# Patient Record
Sex: Female | Born: 1970 | Race: Black or African American | Hispanic: No | Marital: Single | Smoking: Current every day smoker
Health system: Southern US, Community
[De-identification: ages and names within clinical notes are randomized; demographics above are authoritative.]

## PROBLEM LIST (undated history)

## (undated) DIAGNOSIS — D573 Sickle-cell trait: Secondary | ICD-10-CM

## (undated) DIAGNOSIS — E119 Type 2 diabetes mellitus without complications: Secondary | ICD-10-CM

## (undated) DIAGNOSIS — K219 Gastro-esophageal reflux disease without esophagitis: Secondary | ICD-10-CM

## (undated) HISTORY — PX: LEEP: SHX91

## (undated) HISTORY — PX: WRIST SURGERY: SHX841

---

## 2013-04-02 ENCOUNTER — Other Ambulatory Visit: Payer: Self-pay

## 2013-04-23 ENCOUNTER — Other Ambulatory Visit (HOSPITAL_COMMUNITY): Payer: Self-pay | Admitting: Obstetrics and Gynecology

## 2013-04-23 DIAGNOSIS — O09529 Supervision of elderly multigravida, unspecified trimester: Secondary | ICD-10-CM

## 2013-04-24 ENCOUNTER — Encounter (HOSPITAL_COMMUNITY): Payer: Self-pay

## 2013-04-24 ENCOUNTER — Ambulatory Visit (HOSPITAL_COMMUNITY)
Admission: RE | Admit: 2013-04-24 | Discharge: 2013-04-24 | Disposition: A | Discharge status: Home / Self Care | Payer: Medicaid Other | Source: Ambulatory Visit | Attending: Obstetrics and Gynecology | Admitting: Obstetrics and Gynecology

## 2013-04-24 DIAGNOSIS — O24919 Unspecified diabetes mellitus in pregnancy, unspecified trimester: Secondary | ICD-10-CM | POA: Insufficient documentation

## 2013-04-24 DIAGNOSIS — O09529 Supervision of elderly multigravida, unspecified trimester: Secondary | ICD-10-CM | POA: Insufficient documentation

## 2013-04-24 DIAGNOSIS — Z1389 Encounter for screening for other disorder: Secondary | ICD-10-CM | POA: Insufficient documentation

## 2013-04-24 DIAGNOSIS — O352XX0 Maternal care for (suspected) hereditary disease in fetus, not applicable or unspecified: Secondary | ICD-10-CM | POA: Insufficient documentation

## 2013-04-24 DIAGNOSIS — Z363 Encounter for antenatal screening for malformations: Secondary | ICD-10-CM | POA: Insufficient documentation

## 2013-04-24 DIAGNOSIS — O358XX0 Maternal care for other (suspected) fetal abnormality and damage, not applicable or unspecified: Secondary | ICD-10-CM | POA: Insufficient documentation

## 2013-04-24 HISTORY — DX: Type 2 diabetes mellitus without complications: E11.9

## 2013-04-24 HISTORY — DX: Sickle-cell trait: D57.3

## 2013-04-24 LAB — OB RESULTS CONSOLE GC/CHLAMYDIA
Chlamydia: NEGATIVE
Gonorrhea: NEGATIVE

## 2013-04-24 LAB — OB RESULTS CONSOLE RUBELLA ANTIBODY, IGM: Rubella: IMMUNE

## 2013-04-24 LAB — OB RESULTS CONSOLE HEPATITIS B SURFACE ANTIGEN: Hepatitis B Surface Ag: NEGATIVE

## 2013-04-24 LAB — OB RESULTS CONSOLE HGB/HCT, BLOOD
HCT: 36 %
Hemoglobin: 12.4 g/dL

## 2013-04-24 LAB — OB RESULTS CONSOLE HIV ANTIBODY (ROUTINE TESTING): HIV: NONREACTIVE

## 2013-04-24 LAB — OB RESULTS CONSOLE RPR: RPR: NONREACTIVE

## 2013-04-26 NOTE — Progress Notes (Signed)
Genetic Counseling  High-Risk Gestation Note  Appointment Date:  04/24/2013 Referred By: [name], MD Date of Birth:  January 06, 1971    Pregnancy History: [id number] Estimated Date of Delivery: 09/23/13 Estimated Gestational Age: [redacted]w[redacted]d Attending: Particia Nearing, MD   Ms. Jennifer Duffy was seen for genetic counseling regarding a maternal age of 42 y.o.. She will be 65 years old at delivery. She was accompanied by a friend to today's visit. [employer] Genetic Counseling Intern, Ellwood Sayers, assisted with today's genetic counseling under my direct supervision.   She was counseled regarding maternal age and the association with risk for chromosome conditions due to nondisjunction with aging of the ova.   We reviewed chromosomes, nondisjunction, and the associated 1 in 5 risk for fetal aneuploidy related to a maternal age of 42 y.o. at [redacted]w[redacted]d weeks gestation.  She was counseled that the risk for aneuploidy decreases as gestational age increases, accounting for those pregnancies which spontaneously abort.  We specifically discussed Down syndrome (trisomy 57), trisomies 26 and 91, and sex chromosome aneuploidies (47,XXX and 47,XXY) including the common features and prognoses of each.   We also reviewed Ms. Jennifer Duffy' noninvasive prenatal screening (NIPS)/Harmony result, which was previously performed through her OB office. We discussed that these are within normal limits for the conditions screened, showing a less than 1 in 10,000 risk for fetal Down syndrome, trisomy 24, and trisomy 20. In addition, X and Y analysis did not indicate evidence of sex chromosome aneuploidy in the sample, and was consistent with XX (female) gender. We reviewed that this screening detects >99% of pregnancies with Trisomy 21, >98% of pregnancies with Trisomy 18, and approximately 80% of pregnancies with Trisomy 13.   She understands that NIPS provides a pregnancy specific risk for Down syndrome, but is not considered to be  diagnostic.  She was counseled regarding other available screening and diagnostic options including ultrasound and amniocentesis.  The risks, benefits, and limitations of each of these options were reviewed in detail.  We discussed the associated 1 in 300-500 risk for complications from amniocentesis, including spontaneous pregnancy loss. After thoughtful consideration of these options, she elected to proceed with ultrasound only, but declined amniocentesis.  She understands that screening tests cannot rule out all birth defects or genetic syndromes.  The patient was advised of this limitation and states she still does not want diagnostic testing at this time or in the future, given the associated risk of complications.   A complete detailed ultrasound was performed today.  Left sided pyelectasis was visualized at the time of today's ultrasound (4 mm). The remaining visualized fetal anatomy appeared normal. The ultrasound report will be sent under separate cover.  Fetal pyelectasis is defined as the dilatation of the fetal renal pelvis/pelvises due to excess urine. This finding is estimated to occur in 2-3% of fetuses.  The female to female ratio is 2:1.  Typically, babies with mild pyelectasis are born normal and healthy, and we are usually unable to determine why this extra fluid is present.  This urine accumulation may regress, stay the same or continue to accumulate.  The more fluid that accumulates, the more likely this fluid could be the result of a compromise in kidney function, an obstruction, or narrowing of the ureters which transport urine out of the body, thus causing backflow of fluid into the kidneys.  Therefore, it is important to follow pyelectasis to make sure it does not become more concerning.  Also, in some cases postnatal evaluation of baby's kidneys  may be warranted.  The finding of pyelectasis is associated with an increased risk for fetal aneuploidy.  This risk is highest when other  anomalies, fetal differences, or other risk factors for aneuploidy are present. A follow-up ultrasound was planned for 07/04/13 to reevaluate fetal kidneys.   Both family histories were reviewed and found to be contributory for sickle cell anemia in the patient's 40 year-old daughter with a different partner. She reported that her brother also has sickle cell anemia. He is currently 40 years old and also has a history of seizures related to high fevers. Sickle cell anemia affects the shape and function of the red blood cell by producing abnormal hemoglobin. Hemoglobin is a protein in the RBCs that carries oxygen to the body's organs. Individuals who have SCA have a changes within the genes that codes for hemoglobin. This couple was counseled that SCA is inherited in an autosomal recessive manner, and occurs when both copies of the hemoglobin gene are changed and produce an abnormal hemoglobin S. Typically, one abnormal gene for the production of hemoglobin S is inherited from each parent. A carrier of SCA has one altered copy of the gene for hemoglobin and one typical working copy. Carriers of recessive conditions typically do not have symptoms related to the condition because they still have one functioning copy of the gene, and thus some production of the typical protein coded for by that gene.  Given the recessive inheritance, we discussed the importance of understanding carrier status of the father of the pregnancy in order to accurately predict the risk of a hemoglobinopathy in the fetus. The patient reported that the father of the pregnancy has had screening for in the past and that he does NOT have sickle cell trait. She did not have medical records available to verify the reported information. We discussed that other hemoglobin variants (hemoglobin C), when combined with hemoglobin S, can cause a medically significant hemoglobinopathy. We discussed the option of repeat testing (hemoglobin electrophoresis)  for the father of the pregnancy to determine whether he has any hemoglobin variant, including SCT. She understands that an accurate risk assessment cannot be performed without further information. However, assuming that the father of the pregnancy does not have SCT or any other hemoglobin variant, the fetus would not be expected to have an increased risk for a hemoglobinopathy but would have a 1 in 2 (50%) chance to inherit sickle cell trait. We also reviewed the availability of newborn screening in [place] for hemoglobinopathies.   Additionally, the patient reported that her 40 year old son, with a different partner, has epilepsy, with onset at age 65 years. An underlying cause is not known. Epilepsy occurs in approximately 1% of the population and can have many causes.  Approximately 80% of epilepsy is thought to be idiopathic while the remaining 20% is secondary to a variety of factors such as perinatal events, infections, trauma and genetic disease.  A specific diagnosis in an affected individual is necessary to accurately assess the risk for other family members to develop epilepsy.  In the absence of a known etiology, epilepsy is thought to be caused by a combination of genetic and environmental factors, called multifactorial inheritance. Recurrence risk for epilepsy is estimated to be 2.5% for siblings of an individual with primary idiopathic epilepsy. However, given that the current pregnancy is a half-sibling, the recurrence risk may be lower. It would be important for the patient's pediatrician to be aware of this history so that her child(ren)  can be screened and followed appropriately.   The patient also reported a maternal half-brother who died at age 56 years old from colon cancer. She also reported a paternal half-brother (unrelated to her other half-brother) who was diagnosed with colon cancer at age 55 years old. Though most cancers are thought to be sporadic or due to environmental  factors, some families appear to have a strong predisposition to cancers.  When considering a family history of cancer, we look for common types of cancer in multiple family members occurring at younger than typical ages.  If she is concerned about the family history of cancer and would like to learn more about the family's chance for an inherited cancer syndrome, her doctor may refer her to [facility] [phone]). It would be important for her physician to be aware of this history so that she is screened appropriately. Without further information regarding the provided family history, an accurate genetic risk cannot be calculated. Further genetic counseling is warranted if more information is obtained.  Ms. Makenzee Lynam denied exposure to environmental toxins or chemical agents. She denied the use of alcohol, tobacco or street drugs. She denied significant viral illnesses during the course of her pregnancy. Her medical and surgical histories were contributory for diabetes mellitus, for which she is treated with metformin. Fetal echocardiogram was scheduled given the maternal history of diabetes.     I counseled Ms. Mikaia Arya regarding the above risks and available options.  The approximate face-to-face time with the genetic counselor was 40 minutes.  Quinn Plowman, MS Certified Genetic Counselor 04/26/2013

## 2013-07-03 ENCOUNTER — Other Ambulatory Visit (HOSPITAL_COMMUNITY): Payer: Self-pay | Admitting: Obstetrics and Gynecology

## 2013-07-03 DIAGNOSIS — O09529 Supervision of elderly multigravida, unspecified trimester: Secondary | ICD-10-CM

## 2013-07-04 ENCOUNTER — Ambulatory Visit (HOSPITAL_COMMUNITY)
Admission: RE | Admit: 2013-07-04 | Discharge: 2013-07-04 | Disposition: A | Discharge status: Home / Self Care | Payer: Medicaid Other | Source: Ambulatory Visit | Attending: Obstetrics and Gynecology | Admitting: Obstetrics and Gynecology

## 2013-07-04 ENCOUNTER — Other Ambulatory Visit (HOSPITAL_COMMUNITY): Payer: Self-pay | Admitting: Obstetrics and Gynecology

## 2013-07-04 DIAGNOSIS — O352XX0 Maternal care for (suspected) hereditary disease in fetus, not applicable or unspecified: Secondary | ICD-10-CM | POA: Insufficient documentation

## 2013-07-04 DIAGNOSIS — O99330 Smoking (tobacco) complicating pregnancy, unspecified trimester: Secondary | ICD-10-CM | POA: Insufficient documentation

## 2013-07-04 DIAGNOSIS — O09529 Supervision of elderly multigravida, unspecified trimester: Secondary | ICD-10-CM

## 2013-07-04 DIAGNOSIS — O24919 Unspecified diabetes mellitus in pregnancy, unspecified trimester: Secondary | ICD-10-CM | POA: Insufficient documentation

## 2013-08-01 ENCOUNTER — Ambulatory Visit (HOSPITAL_COMMUNITY)
Admission: RE | Admit: 2013-08-01 | Discharge: 2013-08-01 | Disposition: A | Discharge status: Home / Self Care | Payer: Medicaid Other | Source: Ambulatory Visit | Attending: Obstetrics and Gynecology | Admitting: Obstetrics and Gynecology

## 2013-08-01 ENCOUNTER — Other Ambulatory Visit (HOSPITAL_COMMUNITY): Payer: Self-pay | Admitting: Obstetrics and Gynecology

## 2013-08-01 DIAGNOSIS — O09529 Supervision of elderly multigravida, unspecified trimester: Secondary | ICD-10-CM | POA: Insufficient documentation

## 2013-08-01 DIAGNOSIS — O99330 Smoking (tobacco) complicating pregnancy, unspecified trimester: Secondary | ICD-10-CM | POA: Insufficient documentation

## 2013-08-01 DIAGNOSIS — O24919 Unspecified diabetes mellitus in pregnancy, unspecified trimester: Secondary | ICD-10-CM

## 2013-08-01 DIAGNOSIS — O352XX0 Maternal care for (suspected) hereditary disease in fetus, not applicable or unspecified: Secondary | ICD-10-CM | POA: Insufficient documentation

## 2013-08-27 ENCOUNTER — Other Ambulatory Visit (HOSPITAL_COMMUNITY): Payer: Self-pay | Admitting: Obstetrics and Gynecology

## 2013-08-27 DIAGNOSIS — O352XX0 Maternal care for (suspected) hereditary disease in fetus, not applicable or unspecified: Secondary | ICD-10-CM

## 2013-08-27 DIAGNOSIS — O24919 Unspecified diabetes mellitus in pregnancy, unspecified trimester: Secondary | ICD-10-CM

## 2013-08-27 DIAGNOSIS — O99340 Other mental disorders complicating pregnancy, unspecified trimester: Secondary | ICD-10-CM

## 2013-08-27 DIAGNOSIS — O09529 Supervision of elderly multigravida, unspecified trimester: Secondary | ICD-10-CM

## 2013-08-29 ENCOUNTER — Ambulatory Visit (HOSPITAL_COMMUNITY): Payer: Medicaid Other

## 2013-08-29 ENCOUNTER — Encounter (HOSPITAL_COMMUNITY): Payer: Self-pay

## 2013-08-29 ENCOUNTER — Other Ambulatory Visit (HOSPITAL_COMMUNITY): Payer: Self-pay | Admitting: Obstetrics and Gynecology

## 2013-08-29 ENCOUNTER — Ambulatory Visit (HOSPITAL_COMMUNITY)
Admission: RE | Admit: 2013-08-29 | Discharge: 2013-08-29 | Disposition: A | Discharge status: Home / Self Care | Payer: Medicaid Other | Source: Ambulatory Visit | Attending: Obstetrics and Gynecology | Admitting: Obstetrics and Gynecology

## 2013-08-29 DIAGNOSIS — O99340 Other mental disorders complicating pregnancy, unspecified trimester: Secondary | ICD-10-CM

## 2013-08-29 DIAGNOSIS — O09529 Supervision of elderly multigravida, unspecified trimester: Secondary | ICD-10-CM

## 2013-08-29 DIAGNOSIS — O24919 Unspecified diabetes mellitus in pregnancy, unspecified trimester: Secondary | ICD-10-CM

## 2013-08-29 DIAGNOSIS — O352XX0 Maternal care for (suspected) hereditary disease in fetus, not applicable or unspecified: Secondary | ICD-10-CM

## 2013-08-29 DIAGNOSIS — O99330 Smoking (tobacco) complicating pregnancy, unspecified trimester: Secondary | ICD-10-CM | POA: Insufficient documentation

## 2014-02-27 ENCOUNTER — Encounter (HOSPITAL_COMMUNITY): Payer: Self-pay | Admitting: *Deleted

## 2014-04-29 ENCOUNTER — Encounter (HOSPITAL_COMMUNITY): Payer: Self-pay | Admitting: *Deleted

## 2015-03-27 IMAGING — US US OB DETAIL+14 WK
1 series · 12 of 28 positions shown · non-contrast
Comparison: none

[Series 1: us ob detail+14 wk · 0.19mm/px · 12 of 99 slices shown]
[im 4/99]
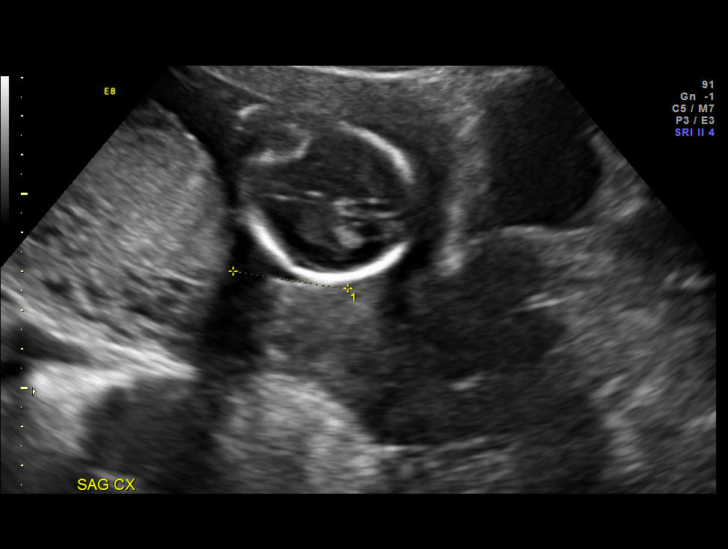
[im 11/99]
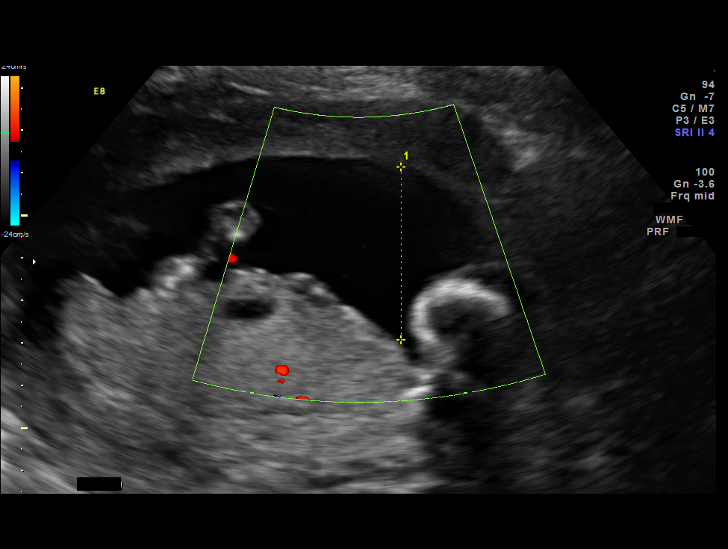
[im 19/99]
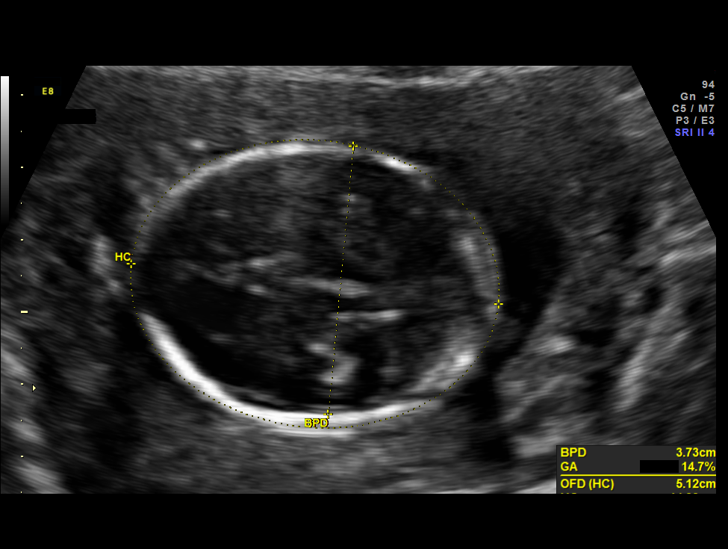
[im 30/99]
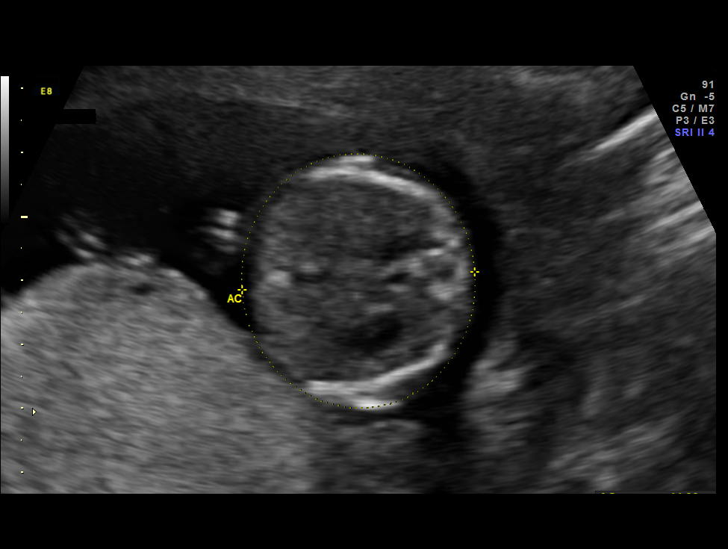
[im 37/99]
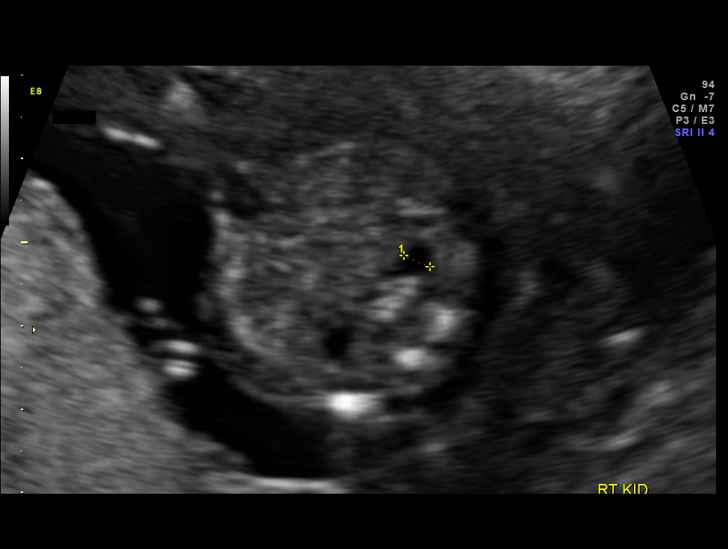
[im 44/99]
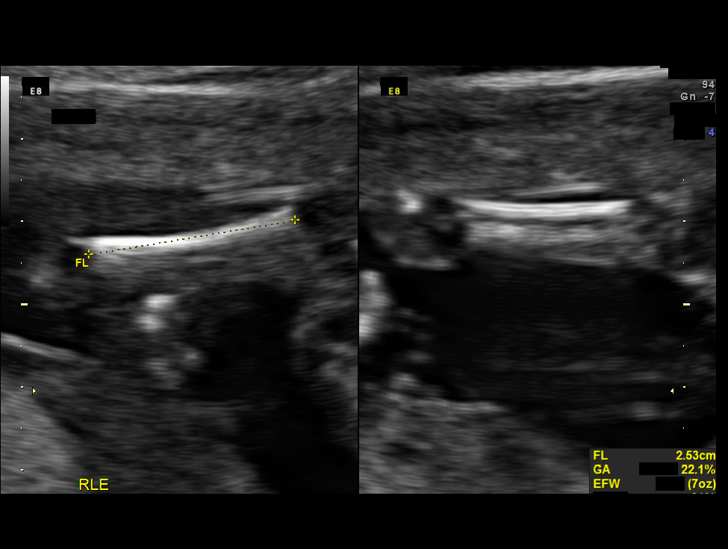
[im 55/99]
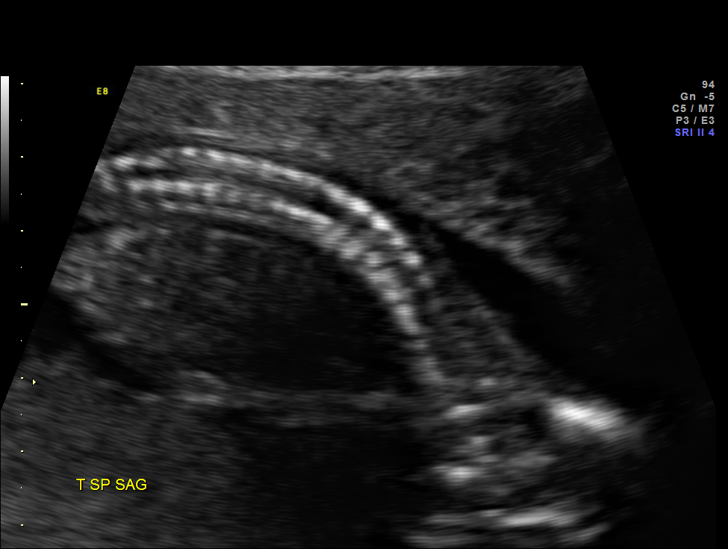
[im 62/99]
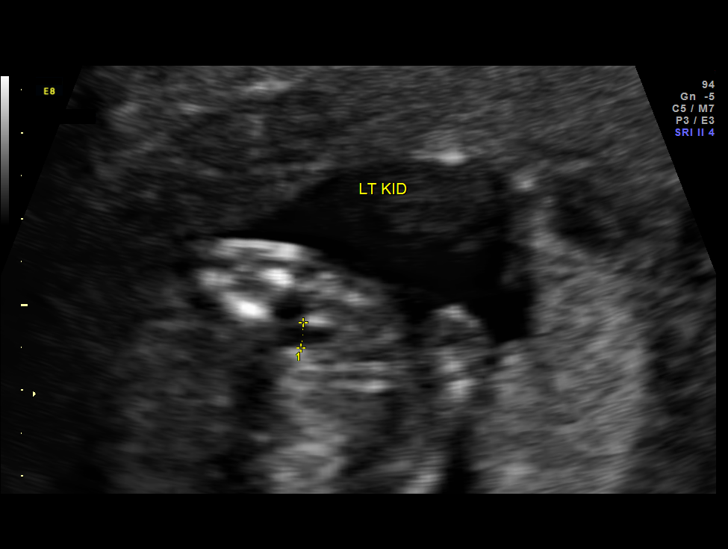
[im 69/99]
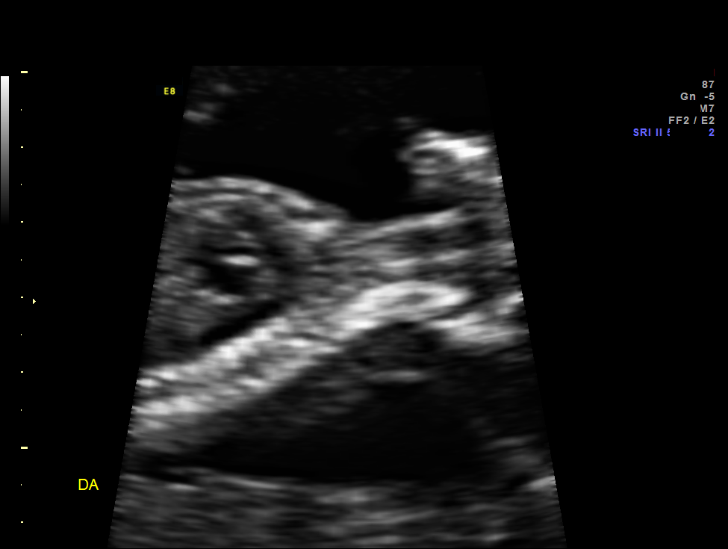
[im 80/99]
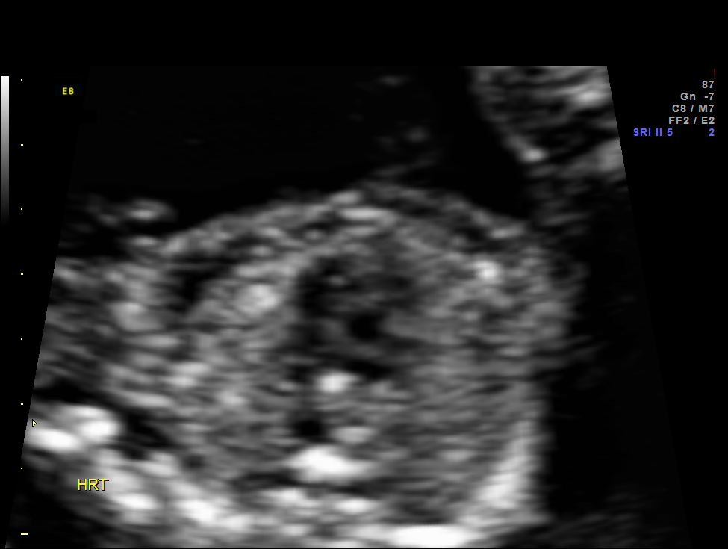
[im 88/99]
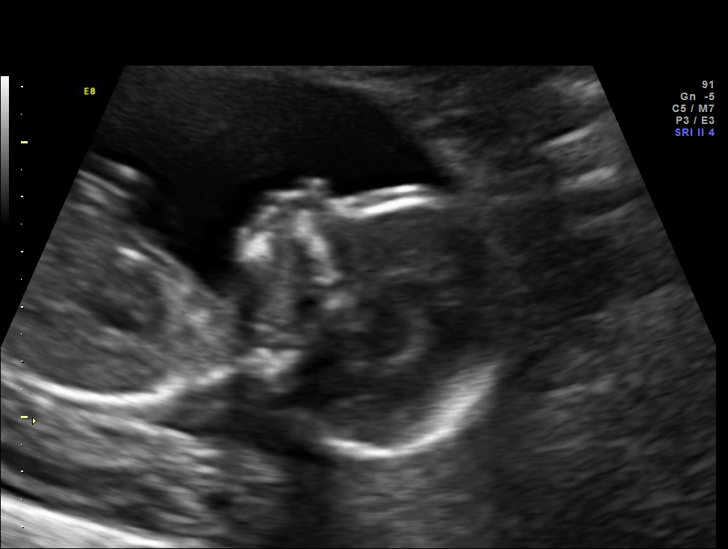
[im 95/99]
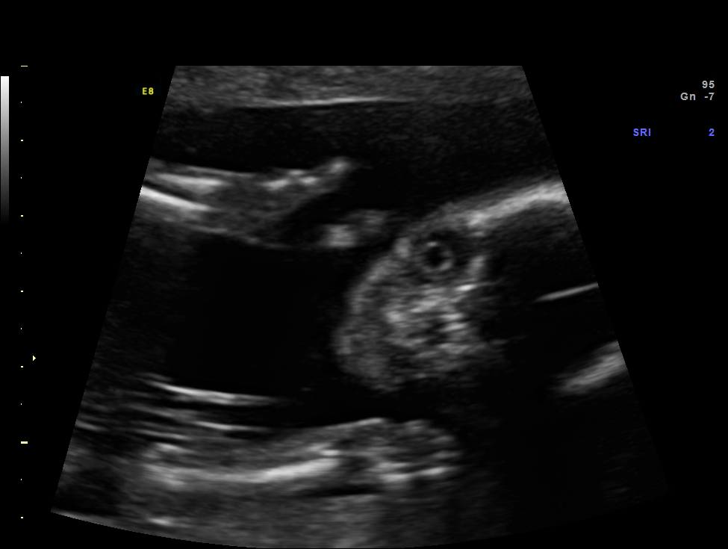

[12 of 28 positions shown; findings below may reference images not displayed]

OBSTETRICS REPORT
                      (Signed Final 04/24/2013 [DATE])

Service(s) Provided

 US OB DETAIL + 14 WK                                  76811.0
Indications

 Detailed fetal anatomic survey
 Advanced maternal age (41), Multigravida - low
 risk Harmony
 Diabetes - Pregestational - type 2 on insulin
 History of genetic / anatomic abnormality -
 daughter with sickle cell, son with seizure disorder
Fetal Evaluation

 Num Of Fetuses:    1
 Fetal Heart Rate:  159                          bpm
 Cardiac Activity:  Observed
 Presentation:      Cephalic
 Placenta:          Posterior, above cervical
                    os
 P. Cord            Visualized, central
 Insertion:

 Amniotic Fluid
 AFI FV:      Subjectively within normal limits
                                             Larg Pckt:     4.0  cm
Biometry

 BPD:       38  mm     G. Age:  17w 4d                CI:         76.9   70 - 86
 OFD:     49.4  mm                                    FL/HC:      18.3   15.8 -
                                                                         18
 HC:     141.4  mm     G. Age:  17w 3d        9  %    HC/AC:      1.16   1.07 -

 AC:     121.6  mm     G. Age:  17w 6d       32  %    FL/BPD:
 FL:      25.9  mm     G. Age:  17w 6d       29  %    FL/AC:      21.3   20 - 24
 HUM:     24.2  mm     G. Age:  17w 4d       31  %
 CER:     17.3  mm     G. Age:  17w 2d       25  %
 NFT:      2.1  mm

 Est. FW:     210  gm      0 lb 7 oz     37  %
Gestational Age

 LMP:           17w 0d        Date:  12/26/12                 EDD:   10/02/13
 U/S Today:     17w 5d                                        EDD:   09/27/13
 Best:          18w 2d     Det. By:  Early Ultrasound         EDD:   09/23/13
Anatomy

 Cranium:          Appears normal         Aortic Arch:      Appears normal
 Fetal Cavum:      Appears normal         Ductal Arch:      Appears normal
 Ventricles:       Appears normal         Diaphragm:        Appears normal
 Choroid Plexus:   Appears normal         Stomach:          Appears normal
 Cerebellum:       Appears normal         Abdomen:          Appears normal
 Posterior Fossa:  Appears normal         Abdominal Wall:   Appears nml (cord
                                                            insert, abd wall)
 Nuchal Fold:      Appears normal         Cord Vessels:     Appears normal (3
                                                            vessel cord)
 Face:             Appears normal         Kidneys:          Left sided
                   (orbits and profile)
                                                            pyelectasis, 4 mm
 Lips:             Appears normal         Bladder:          Appears normal
 Heart:            Appears normal         Spine:            Appears normal
                   (4CH, axis, and
                   situs)
 RVOT:             Appears normal         Lower             Appears normal
                                          Extremities:
 LVOT:             Appears normal         Upper             Appears normal
                                          Extremities:

 Other:  Fetus appears to be a female. Heels and 5th digit visualized. Nasal
         bone visualized.
Targeted Anatomy

 Fetal Central Nervous System
 Cisterna Magna:
Cervix Uterus Adnexa

 Cervical Length:    3.4      cm

 Cervix:       Normal appearance by transabdominal scan.

 Adnexa:     No abnormality visualized.
Impression

 IUP at 18+2 weeks
 Normal detailed fetal anatomy except for mild, unilateral
 pyelectasis
 Other markers of aneuploidy: none
 Normal amniotic fluid volume
 Measurements consistent with prior US

 The US findings were shared with Ms. Schertz. The
 implications of pyelectasis were discussed in detail. Her
 Harmony was negative for trisomies. After careful
 consideration, she declined any other testing.
Recommendations

 Follow-up ultrasound in 8 weeks to reassess kidneys
 Fetal ECHO scheduled
 questions or concerns.

## 2017-01-20 ENCOUNTER — Encounter (HOSPITAL_BASED_OUTPATIENT_CLINIC_OR_DEPARTMENT_OTHER): Payer: Self-pay

## 2017-01-20 ENCOUNTER — Emergency Department (HOSPITAL_BASED_OUTPATIENT_CLINIC_OR_DEPARTMENT_OTHER)
Admission: EM | Admit: 2017-01-20 | Discharge: 2017-01-20 | Disposition: A | Discharge status: Home / Self Care | Payer: BLUE CROSS/BLUE SHIELD | Attending: Emergency Medicine | Admitting: Emergency Medicine

## 2017-01-20 DIAGNOSIS — M79671 Pain in right foot: Secondary | ICD-10-CM | POA: Diagnosis not present

## 2017-01-20 DIAGNOSIS — F17200 Nicotine dependence, unspecified, uncomplicated: Secondary | ICD-10-CM | POA: Diagnosis not present

## 2017-01-20 DIAGNOSIS — D573 Sickle-cell trait: Secondary | ICD-10-CM | POA: Insufficient documentation

## 2017-01-20 DIAGNOSIS — E119 Type 2 diabetes mellitus without complications: Secondary | ICD-10-CM | POA: Insufficient documentation

## 2017-01-20 DIAGNOSIS — R2241 Localized swelling, mass and lump, right lower limb: Secondary | ICD-10-CM | POA: Diagnosis present

## 2017-01-20 HISTORY — DX: Gastro-esophageal reflux disease without esophagitis: K21.9

## 2017-01-20 MED ORDER — HYDROCODONE-ACETAMINOPHEN 5-325 MG PO TABS: 1.0000 | ORAL_TABLET | ORAL | 0 refills | Status: AC | PRN

## 2017-01-20 NOTE — ED Notes (Signed)
C/o rt foot pain off and on x 1 week  Denies inj

## 2017-01-20 NOTE — ED Triage Notes (Signed)
Reports intermittent right foot swelling,.  Denies injury

## 2017-01-20 NOTE — ED Notes (Signed)
Patient walked in with minimal limp and upon discharge refused to walk on foot and hopped out of the department.

## 2017-01-20 NOTE — ED Notes (Signed)
PMS intact before and after. Pt tolerated well. All questions answered. 

## 2017-01-20 NOTE — ED Provider Notes (Signed)
[facility] Provider Note: Jennifer Duffy Chasitie Passey, MD, FACEP  CSN: [id number] MRN: [id number] ARRIVAL: 01/20/17 at 0025 ROOM: [facility]02/[facility]   CHIEF COMPLAINT  Foot Swelling   HISTORY OF PRESENT ILLNESS  Page SpiroLatrice Duffy is a 46 y.o. female with a two-day history of pain in her right foot. The pain is located primarily on the lateral aspect of the plantar surface as well as over the right fifth metatarsal. She denies initiating trauma. There is some mild associated swelling but no redness. About a week ago she had an episode of pain in the same foot which was located in the right first metacarpophalangeal joint. She has been taking ibuprofen 800 milligrams 3 times a day without adequate relief. She rates her pain as a 10 out of 10, worse with weightbearing or palpation. There is no pain in the right ankle are great toe at the present time.  [facility] substances [place] reveals the patient has received no opioid prescriptions the past year.    Past Medical History:  Diagnosis Date  . Diabetes mellitus without complication (HCC)   . GERD (gastroesophageal reflux disease)   . Sickle cell trait [facility](HCC)     Past Surgical History:  Procedure Laterality Date  . LEEP  1994, 1997  . WRIST SURGERY Bilateral     No family history on file.  Social History  Substance Use Topics  . Smoking status: Current Every Day Smoker  . Smokeless tobacco: Never Used  . Alcohol use Yes    Prior to Admission medications   Medication Sig Start Date End Date Taking? Authorizing Provider  cetirizine (ZYRTEC) 10 MG tablet Take 10 mg by mouth daily.    [provider]    Allergies Iodine   REVIEW OF SYSTEMS  Negative except as noted here or in the History of Present Illness.   PHYSICAL EXAMINATION  Initial Vital Signs Blood pressure (!) 150/95, pulse 88, temperature 98.5 F (36.9 C), temperature source Oral, resp. rate 16, height 5\' 5"  (1.651  m), weight 69.4 kg (153 lb), last menstrual period 01/10/2017, SpO2 100 %, unknown if currently breastfeeding.  Examination General: Well-developed, well-nourished female in no acute distress; appearance consistent with age of record HENT: normocephalic; atraumatic Eyes: Normal appearance Neck: supple Heart: regular rate and rhythm Lungs: clear to auscultation bilaterally Abdomen: soft; nondistended; nontender; bowel sounds present Extremities: No deformity; pulses normal; tenderness over right fifth metatarsophalangeal joint and lateral aspect of plantar surface of right foot with mild swelling but no erythema; no tenderness of right ankle or right first metatarsophalangeal joint Neurologic: Awake, alert and oriented; motor function intact in all extremities and symmetric; no facial droop Skin: Warm and dry Psychiatric: Normal mood and affect   RESULTS  Summary of this visit's results, reviewed by myself:   EKG Interpretation  Date/Time:    Ventricular Rate:    PR Interval:    QRS Duration:   QT Interval:    QTC Calculation:   R Axis:     Text Interpretation:        Laboratory Studies: No results found for this or any previous visit (from the past 24 hour(s)). Imaging Studies: No results found.  ED COURSE  Nursing notes and initial vitals signs, including pulse oximetry, reviewed.  Vitals:   01/20/17 0029  BP: (!) 150/95  Pulse: 88  Resp: 16  Temp: 98.5 F (36.9 C)  TempSrc: Oral  SpO2: 100%  Weight: 69.4 kg (153 lb)  Height:  5\' 5"  (1.651 m)   We'll provide postop shoe and crutches as well as analgesia. She was advised to continue the ibuprofen and will refer to podiatry.  PROCEDURES    ED DIAGNOSES     ICD-10-CM   1. Acute pain of right foot M79.671        Paula LibraMolpus, Abbygael Curtiss, MD 01/20/17 [phone]

## 2021-05-18 ENCOUNTER — Other Ambulatory Visit: Payer: Self-pay | Admitting: Physician Assistant

## 2021-05-18 DIAGNOSIS — R1084 Generalized abdominal pain: Secondary | ICD-10-CM

## 2021-05-19 ENCOUNTER — Other Ambulatory Visit: Payer: Self-pay | Admitting: Nurse Practitioner

## 2021-05-19 DIAGNOSIS — R1084 Generalized abdominal pain: Secondary | ICD-10-CM
